# Patient Record
Sex: Male | Born: 1945 | Race: White | Hispanic: No | Marital: Married | Smoking: Former smoker
Health system: Southern US, Community
[De-identification: ages and names within clinical notes are randomized; demographics above are authoritative.]

## PROBLEM LIST (undated history)

## (undated) DIAGNOSIS — K219 Gastro-esophageal reflux disease without esophagitis: Secondary | ICD-10-CM

## (undated) DIAGNOSIS — E785 Hyperlipidemia, unspecified: Secondary | ICD-10-CM

## (undated) DIAGNOSIS — M329 Systemic lupus erythematosus, unspecified: Secondary | ICD-10-CM

## (undated) DIAGNOSIS — R42 Dizziness and giddiness: Secondary | ICD-10-CM

## (undated) DIAGNOSIS — E039 Hypothyroidism, unspecified: Secondary | ICD-10-CM

## (undated) DIAGNOSIS — G4730 Sleep apnea, unspecified: Secondary | ICD-10-CM

## (undated) DIAGNOSIS — M1990 Unspecified osteoarthritis, unspecified site: Secondary | ICD-10-CM

## (undated) DIAGNOSIS — IMO0002 Reserved for concepts with insufficient information to code with codable children: Secondary | ICD-10-CM

## (undated) HISTORY — PX: HERNIA REPAIR: SHX51

## (undated) HISTORY — PX: COLONOSCOPY: SHX174

## (undated) HISTORY — PX: TOTAL HIP ARTHROPLASTY: SHX124

## (undated) HISTORY — PX: SIGMOIDOSCOPY: SUR1295

---

## 2016-10-19 ENCOUNTER — Encounter: Payer: Self-pay | Admitting: *Deleted

## 2016-10-20 ENCOUNTER — Ambulatory Visit: Payer: Medicare Other | Admitting: Anesthesiology

## 2016-10-20 ENCOUNTER — Encounter
Admission: RE | Disposition: A | Discharge status: Home / Self Care | Payer: Self-pay | Source: Ambulatory Visit | Attending: Unknown Physician Specialty

## 2016-10-20 ENCOUNTER — Ambulatory Visit
Admission: RE | Admit: 2016-10-20 | Discharge: 2016-10-20 | Disposition: A | Discharge status: Home / Self Care | Payer: Medicare Other | Source: Ambulatory Visit | Attending: Unknown Physician Specialty | Admitting: Unknown Physician Specialty

## 2016-10-20 ENCOUNTER — Encounter: Payer: Self-pay | Admitting: Anesthesiology

## 2016-10-20 DIAGNOSIS — Z96649 Presence of unspecified artificial hip joint: Secondary | ICD-10-CM | POA: Diagnosis not present

## 2016-10-20 DIAGNOSIS — K449 Diaphragmatic hernia without obstruction or gangrene: Secondary | ICD-10-CM | POA: Diagnosis not present

## 2016-10-20 DIAGNOSIS — K640 First degree hemorrhoids: Secondary | ICD-10-CM | POA: Diagnosis not present

## 2016-10-20 DIAGNOSIS — E039 Hypothyroidism, unspecified: Secondary | ICD-10-CM | POA: Insufficient documentation

## 2016-10-20 DIAGNOSIS — Z87891 Personal history of nicotine dependence: Secondary | ICD-10-CM | POA: Diagnosis not present

## 2016-10-20 DIAGNOSIS — K635 Polyp of colon: Secondary | ICD-10-CM | POA: Diagnosis not present

## 2016-10-20 DIAGNOSIS — K219 Gastro-esophageal reflux disease without esophagitis: Secondary | ICD-10-CM | POA: Diagnosis not present

## 2016-10-20 DIAGNOSIS — G4730 Sleep apnea, unspecified: Secondary | ICD-10-CM | POA: Diagnosis not present

## 2016-10-20 DIAGNOSIS — K5730 Diverticulosis of large intestine without perforation or abscess without bleeding: Secondary | ICD-10-CM | POA: Insufficient documentation

## 2016-10-20 DIAGNOSIS — F419 Anxiety disorder, unspecified: Secondary | ICD-10-CM | POA: Insufficient documentation

## 2016-10-20 DIAGNOSIS — M329 Systemic lupus erythematosus, unspecified: Secondary | ICD-10-CM | POA: Insufficient documentation

## 2016-10-20 DIAGNOSIS — Z1211 Encounter for screening for malignant neoplasm of colon: Secondary | ICD-10-CM | POA: Diagnosis not present

## 2016-10-20 DIAGNOSIS — K222 Esophageal obstruction: Secondary | ICD-10-CM | POA: Insufficient documentation

## 2016-10-20 HISTORY — PX: COLONOSCOPY WITH PROPOFOL: SHX5780

## 2016-10-20 HISTORY — DX: Hyperlipidemia, unspecified: E78.5

## 2016-10-20 HISTORY — DX: Hypothyroidism, unspecified: E03.9

## 2016-10-20 HISTORY — PX: ESOPHAGOGASTRODUODENOSCOPY (EGD) WITH PROPOFOL: SHX5813

## 2016-10-20 HISTORY — DX: Sleep apnea, unspecified: G47.30

## 2016-10-20 HISTORY — DX: Systemic lupus erythematosus, unspecified: M32.9

## 2016-10-20 HISTORY — DX: Gastro-esophageal reflux disease without esophagitis: K21.9

## 2016-10-20 SURGERY — COLONOSCOPY WITH PROPOFOL
Anesthesia: General

## 2016-10-20 MED ORDER — PIPERACILLIN-TAZOBACTAM 3.375 G IVPB 30 MIN
3.3750 g | Freq: Once | INTRAVENOUS | Status: AC
  Administered 2016-10-20: 3.375 g via INTRAVENOUS
  Filled 2016-10-20: qty 50

## 2016-10-20 MED ORDER — GLYCOPYRROLATE 0.2 MG/ML IJ SOLN
INTRAMUSCULAR | Status: AC
  Filled 2016-10-20: qty 1

## 2016-10-20 MED ORDER — LIDOCAINE 2% (20 MG/ML) 5 ML SYRINGE
INTRAMUSCULAR | Status: AC
  Filled 2016-10-20: qty 5

## 2016-10-20 MED ORDER — PROPOFOL 10 MG/ML IV BOLUS
INTRAVENOUS | Status: AC
  Filled 2016-10-20: qty 20

## 2016-10-20 MED ORDER — LIDOCAINE HCL (CARDIAC) 20 MG/ML IV SOLN
INTRAVENOUS | Status: DC | PRN
  Administered 2016-10-20 (×4): 25 mg via INTRAVENOUS

## 2016-10-20 MED ORDER — PROPOFOL 500 MG/50ML IV EMUL
INTRAVENOUS | Status: DC | PRN
  Administered 2016-10-20 (×2): 120 ug/kg/min via INTRAVENOUS

## 2016-10-20 MED ORDER — SODIUM CHLORIDE 0.9 % IV SOLN: INTRAVENOUS | Status: DC

## 2016-10-20 MED ORDER — GLYCOPYRROLATE 0.2 MG/ML IJ SOLN
INTRAMUSCULAR | Status: DC | PRN
  Administered 2016-10-20: 0.2 mg via INTRAVENOUS

## 2016-10-20 MED ORDER — PROPOFOL 500 MG/50ML IV EMUL
INTRAVENOUS | Status: AC
  Filled 2016-10-20: qty 50

## 2016-10-20 MED ORDER — SODIUM CHLORIDE 0.9 % IV SOLN
INTRAVENOUS | Status: DC
Start: 2016-10-20 — End: 2016-10-20
  Administered 2016-10-20: 12:00:00 via INTRAVENOUS

## 2016-10-20 MED ORDER — PROPOFOL 10 MG/ML IV BOLUS
INTRAVENOUS | Status: DC | PRN
  Administered 2016-10-20: 50 mg via INTRAVENOUS
  Administered 2016-10-20: 20 mg via INTRAVENOUS
  Administered 2016-10-20 (×2): 50 mg via INTRAVENOUS
  Administered 2016-10-20: 40 mg via INTRAVENOUS
  Administered 2016-10-20: 50 mg via INTRAVENOUS

## 2016-10-20 NOTE — Op Note (Addendum)
[facility] Gastroenterology Patient Name: Cameron Yu Procedure Date: 10/20/2016 12:40 PM MRN: [id number] Account #: [id number] Date of Birth: 1946-08-09 Admit Type: Outpatient Age: 70 Room: [facility] ENDO ROOM 1 Gender: Male Note Status: Finalized Procedure:            Upper GI endoscopy Indications:          Dysphagia Providers:            Scot Junobert T. Elliott, MD Referring MD:         Danella PentonMark F. Miller, MD (Referring MD) Medicines:            Propofol per Anesthesia Complications:        No immediate complications. Procedure:            Pre-Anesthesia Assessment:                       - After reviewing the risks and benefits, the patient                        was deemed in satisfactory condition to undergo the                        procedure.                       After obtaining informed consent, the endoscope was                        passed under direct vision. Throughout the procedure,                        the patient's blood pressure, pulse, and oxygen                        saturations were monitored continuously. The Endoscope                        was introduced through the mouth, and advanced to the                        second part of duodenum. The upper GI endoscopy was                        accomplished without difficulty. The patient tolerated                        the procedure well. Findings:      A mild Schatzki ring (acquired) was found at the gastroesophageal       junction. A guidewire was placed and the scope was withdrawn. Dilation       was performed with a Savary dilator with no resistance at 17 mm.      A medium-sized hiatal hernia was present.      One non-bleeding superficial duodenal ulcer with no stigmata of bleeding       was found in the duodenal bulb. The lesion was 4 mm in largest dimension.      The examined second portion of the duodenum was normal.      The scope was reinserted after the colonoscopy and biopsies done of  soft       tissue at the GEJ to rule  out neoplasm. Impression:           - Mild Schatzki ring. Dilated.                       - Medium-sized hiatal hernia.                       - One non-bleeding duodenal ulcer with no stigmata of                        bleeding.                       - Normal examined duodenum.                       - No specimens collected. Recommendation:       - Perform a colonoscopy as previously scheduled. Procedure Code(s):    --- Professional ---                       [id number], Esophagogastroduodenoscopy, flexible, transoral;                        with insertion of guide wire followed by passage of                        dilator(s) through esophagus over guide wire Diagnosis Code(s):    --- Professional ---                       K22.2, Esophageal obstruction                       K44.9, Diaphragmatic hernia without obstruction or                        gangrene                       K26.9, Duodenal ulcer, unspecified as acute or chronic,                        without hemorrhage or perforation                       R13.10, Dysphagia, unspecified CPT copyright 2016 American Medical Association. All rights reserved. The codes documented in this report are preliminary and upon coder review may  be revised to meet current compliance requirements. Scot Junobert T Elliott, MD 10/20/2016 12:59:38 PM This report has been signed electronically. Number of Addenda: 0 Note Initiated On: 10/20/2016 12:40 PM      [facility]

## 2016-10-20 NOTE — Anesthesia Postprocedure Evaluation (Signed)
Anesthesia Post Note  Patient: Cameron Sourshomas Rocha Jr.  Procedure(Yu) Performed: Procedure(Yu) (LRB): COLONOSCOPY WITH PROPOFOL (N/A) ESOPHAGOGASTRODUODENOSCOPY (EGD) WITH PROPOFOL (N/A)  Patient location during evaluation: Endoscopy Anesthesia Type: General Level of consciousness: awake and alert Pain management: pain level controlled Vital Signs Assessment: post-procedure vital signs reviewed and stable Respiratory status: spontaneous breathing, nonlabored ventilation, respiratory function stable and patient connected to nasal cannula oxygen Cardiovascular status: blood pressure returned to baseline and stable Postop Assessment: no signs of nausea or vomiting Anesthetic complications: no     Last Vitals:  Vitals:   10/20/16 1356 10/20/16 1406  BP: 105/70 121/69  Pulse: 73 72  Resp: 15 14  Temp:      Last Pain:  Vitals:   10/20/16 1406  TempSrc:   PainSc: 0-No pain                 Cameron Yu,Cameron Yu

## 2016-10-20 NOTE — Transfer of Care (Signed)
Immediate Anesthesia Transfer of Care Note  Patient: Cameron Sourshomas Nobrega Jr.  Procedure(s) Performed: Procedure(s): COLONOSCOPY WITH PROPOFOL (N/A) ESOPHAGOGASTRODUODENOSCOPY (EGD) WITH PROPOFOL (N/A)  Patient Location: Endoscopy Unit  Anesthesia Type:General  Level of Consciousness: awake and alert   Airway & Oxygen Therapy: Patient Spontanous Breathing and Patient connected to nasal cannula oxygen  Post-op Assessment: Report given to RN and Post -op Vital signs reviewed and stable  Post vital signs: Reviewed  Last Vitals:  Vitals:   10/20/16 1336 10/20/16 1337  BP:  109/81  Pulse: (P) 85 86  Resp: (P) 17 17  Temp:  36.3 C    Last Pain:  Vitals:   10/20/16 1201  TempSrc: Oral         Complications: No apparent anesthesia complications

## 2016-10-20 NOTE — H&P (Signed)
Primary Care Physician:  Danella PentonMark F Miller, MD Primary Gastroenterologist:  Dr. Mechele CollinElliott  Pre-Procedure History & Physical: HPI:  Cameron Sourshomas Matos Jr. is a 70 y.o. male is here for an endoscopy and colonoscopy.   Past Medical History:  Diagnosis Date  . Elevated lipids   . GERD (gastroesophageal reflux disease)   . Hypothyroidism   . SLE exacerbation (HCC)   . Sleep apnea     Past Surgical History:  Procedure Laterality Date  . COLONOSCOPY    . HERNIA REPAIR Left 2014 and 2016  . SIGMOIDOSCOPY    . TOTAL HIP ARTHROPLASTY      Prior to Admission medications   Medication Sig Start Date End Date Taking? Authorizing Provider  ALPRAZolam Prudy Feeler(XANAX) 0.25 MG tablet Take 0.25 mg by mouth at bedtime as needed for anxiety.   Yes Historical Provider, MD  Ascorbic Acid (VITAMIN C) 100 MG tablet Take 100 mg by mouth daily.   Yes Historical Provider, MD  atorvastatin (LIPITOR) 80 MG tablet Take 80 mg by mouth daily.   Yes Historical Provider, MD  buPROPion (WELLBUTRIN XL) 150 MG 24 hr tablet Take 150 mg by mouth daily.   Yes Historical Provider, MD  CA-MG-VIT D-L METHYLFOL-B6-B12 PO Take by mouth.   Yes Historical Provider, MD  Cholecalciferol (VITAMIN D3) 2000 units CHEW Chew 2,000 capsules by mouth.   Yes Historical Provider, MD  desoximetasone (TOPICORT) 0.05 % cream Apply topically 2 (two) times daily.   Yes Historical Provider, MD  fluocinonide gel (LIDEX) 0.05 % Apply 1 application topically 2 (two) times daily.   Yes Historical Provider, MD  hydroxychloroquine (PLAQUENIL) 200 MG tablet Take 200 mg by mouth daily.   Yes Historical Provider, MD  levothyroxine (SYNTHROID, LEVOTHROID) 50 MCG tablet Take 50 mcg by mouth daily before breakfast.   Yes Historical Provider, MD  omeprazole (PRILOSEC) 20 MG capsule Take 20 mg by mouth daily.   Yes Historical Provider, MD  predniSONE (DELTASONE) 1 MG tablet Take 1 mg by mouth daily with breakfast.   Yes Historical Provider, MD  vitamin A [id number] UNIT  capsule Take 10,000 Units by mouth daily.   Yes Historical Provider, MD    Allergies as of 06/09/2016  . (Not on File)    History reviewed. No pertinent family history.  Social History   Social History  . Marital status: Married    Spouse name: N/A  . Number of children: N/A  . Years of education: N/A   Occupational History  . Not on file.   Social History Main Topics  . Smoking status: Former Games developermoker  . Smokeless tobacco: Never Used  . Alcohol use Yes     Comment: occasional  . Drug use: No  . Sexual activity: Not on file   Other Topics Concern  . Not on file   Social History Narrative  . No narrative on file    Review of Systems: See HPI, otherwise negative ROS,  Patient to get Zosyn for artificial hip joints.  Physical Exam: BP (!) 144/77   Pulse 86   Temp 97.5 F (36.4 C) (Oral)   Resp 16   Ht 5\' 5"  (1.651 m)   Wt 90.7 kg (200 lb)   SpO2 97%   BMI 33.28 kg/m  General:   Alert,  pleasant and cooperative in NAD Head:  Normocephalic and atraumatic. Neck:  Supple; no masses or thyromegaly. Lungs:  Clear throughout to auscultation.    Heart:  Regular rate and rhythm. Abdomen:  Soft,  nontender and nondistended. Normal bowel sounds, without guarding, and without rebound.   Neurologic:  Alert and  oriented x4;  grossly normal neurologically.  Impression/Plan: Cameron Sourshomas Dombrosky Jr. is here for an endoscopy and colonoscopy to be performed for heartburn/GERD andcolon screening.  Risks, benefits, limitations, and alternatives regarding  endoscopy and colonoscopy have been reviewed with the patient.  Questions have been answered.  All parties agreeable.   Lynnae PrudeELLIOTT, ROBERT, MD  10/20/2016, 12:41 PM

## 2016-10-20 NOTE — Anesthesia Preprocedure Evaluation (Addendum)
Anesthesia Evaluation  Patient identified by MRN, date of birth, ID band Patient awake    Reviewed: Allergy & Precautions, NPO status , Patient's Chart, lab work & pertinent test results, reviewed documented beta blocker date and time   Airway Mallampati: III  TM Distance: >3 FB     Dental  (+) Chipped, Partial Upper   Pulmonary sleep apnea , former smoker,           Cardiovascular      Neuro/Psych    GI/Hepatic GERD  ,  Endo/Other  Hypothyroidism   Renal/GU      Musculoskeletal   Abdominal   Peds  Hematology   Anesthesia Other Findings   Reproductive/Obstetrics                            Anesthesia Physical Anesthesia Plan  ASA: III  Anesthesia Plan: General   Post-op Pain Management:    Induction: Intravenous  Airway Management Planned: Nasal Cannula  Additional Equipment:   Intra-op Plan:   Post-operative Plan:   Informed Consent: I have reviewed the patients History and Physical, chart, labs and discussed the procedure including the risks, benefits and alternatives for the proposed anesthesia with the patient or authorized representative who has indicated his/her understanding and acceptance.     Plan Discussed with: CRNA  Anesthesia Plan Comments:         Anesthesia Quick Evaluation

## 2016-10-20 NOTE — Op Note (Addendum)
[facility] Gastroenterology Patient Name: Cameron Yu Procedure Date: 10/20/2016 12:39 PM MRN: [id number] Account #: [id number] Date of Birth: 09-09-1946 Admit Type: Outpatient Age: 70 [facility]OM 1 Gender: Male Note Status: Finalized Procedure:            Colonoscopy Indications:          Screening for colorectal malignant neoplasm Providers:            Scot Junobert T. Sharna Gabrys, MD Referring MD:         Danella PentonMark F. Miller, MD (Referring MD) Medicines:            Propofol per Anesthesia Complications:        No immediate complications. Procedure:            Pre-Anesthesia Assessment:                       - After reviewing the risks and benefits, the patient                        was deemed in satisfactory condition to undergo the                        procedure.                       After obtaining informed consent, the colonoscope was                        passed under direct vision. Throughout the procedure,                        the patient's blood pressure, pulse, and oxygen                        saturations were monitored continuously. The                        Colonoscope was introduced through the anus and                        advanced to the the cecum, identified by appendiceal                        orifice and ileocecal valve. The colonoscopy was                        performed without difficulty. The patient tolerated the                        procedure well. The quality of the bowel preparation                        was good. Findings:      A medium polyp was found in the descending colon. The polyp was sessile.       The polyp was removed with a hot snare. Resection and retrieval were       complete.      Multiple medium-mouthed diverticula were found in the sigmoid colon,       descending colon, transverse colon and ascending colon.      Internal hemorrhoids were found during endoscopy.  The hemorrhoids were       small and Grade I  (internal hemorrhoids that do not prolapse).      The exam was otherwise without abnormality. Impression:           - One medium polyp in the descending colon, removed                        with a hot snare. Resected and retrieved.                       - Diverticulosis in the sigmoid colon, in the                        descending colon, in the transverse colon and in the                        ascending colon.                       - Internal hemorrhoids.                       - The examination was otherwise normal. Recommendation:       - Await pathology results. Scot Junobert T Carmen Vallecillo, MD 10/20/2016 1:24:35 PM This report has been signed electronically. Number of Addenda: 0 Note Initiated On: 10/20/2016 12:39 PM Scope Withdrawal Time: 0 hours 15 minutes 28 seconds  Total Procedure Duration: 0 hours 20 minutes 31 seconds       [facility]

## 2016-10-24 ENCOUNTER — Encounter: Payer: Self-pay | Admitting: Unknown Physician Specialty

## 2016-10-24 LAB — SURGICAL PATHOLOGY

## 2019-05-26 ENCOUNTER — Other Ambulatory Visit: Payer: Self-pay | Admitting: Internal Medicine

## 2019-05-26 DIAGNOSIS — G459 Transient cerebral ischemic attack, unspecified: Secondary | ICD-10-CM

## 2019-07-16 ENCOUNTER — Ambulatory Visit
Admission: RE | Admit: 2019-07-16 | Discharge: 2019-07-16 | Disposition: A | Discharge status: Home / Self Care | Payer: Medicare Other | Source: Ambulatory Visit | Attending: Internal Medicine | Admitting: Internal Medicine

## 2019-07-16 ENCOUNTER — Other Ambulatory Visit: Payer: Self-pay

## 2019-07-16 DIAGNOSIS — G459 Transient cerebral ischemic attack, unspecified: Secondary | ICD-10-CM

## 2019-07-16 LAB — POCT I-STAT CREATININE: Creatinine, Ser: 1.1 mg/dL (ref 0.61–1.24)

## 2019-07-16 MED ORDER — GADOBUTROL 1 MMOL/ML IV SOLN
10.0000 mL | Freq: Once | INTRAVENOUS | Status: AC | PRN
  Administered 2019-07-16: 10 mL via INTRAVENOUS

## 2021-11-01 ENCOUNTER — Encounter: Payer: Self-pay | Admitting: Ophthalmology

## 2021-11-02 ENCOUNTER — Encounter: Payer: Self-pay | Admitting: Ophthalmology

## 2021-11-10 NOTE — Discharge Instructions (Signed)

## 2021-11-15 ENCOUNTER — Ambulatory Visit: Payer: Medicare Other | Admitting: Anesthesiology

## 2021-11-15 ENCOUNTER — Encounter: Payer: Self-pay | Admitting: Ophthalmology

## 2021-11-15 ENCOUNTER — Ambulatory Visit
Admission: RE | Admit: 2021-11-15 | Discharge: 2021-11-15 | Disposition: A | Discharge status: Home / Self Care | Payer: Medicare Other | Attending: Ophthalmology | Admitting: Ophthalmology

## 2021-11-15 ENCOUNTER — Encounter
Admission: RE | Disposition: A | Discharge status: Home / Self Care | Payer: Self-pay | Source: Home / Self Care | Attending: Ophthalmology

## 2021-11-15 DIAGNOSIS — E039 Hypothyroidism, unspecified: Secondary | ICD-10-CM | POA: Diagnosis not present

## 2021-11-15 DIAGNOSIS — H2511 Age-related nuclear cataract, right eye: Secondary | ICD-10-CM | POA: Diagnosis present

## 2021-11-15 DIAGNOSIS — K219 Gastro-esophageal reflux disease without esophagitis: Secondary | ICD-10-CM | POA: Diagnosis not present

## 2021-11-15 DIAGNOSIS — F419 Anxiety disorder, unspecified: Secondary | ICD-10-CM | POA: Insufficient documentation

## 2021-11-15 DIAGNOSIS — E785 Hyperlipidemia, unspecified: Secondary | ICD-10-CM | POA: Insufficient documentation

## 2021-11-15 DIAGNOSIS — G4730 Sleep apnea, unspecified: Secondary | ICD-10-CM | POA: Insufficient documentation

## 2021-11-15 DIAGNOSIS — Z87891 Personal history of nicotine dependence: Secondary | ICD-10-CM | POA: Insufficient documentation

## 2021-11-15 HISTORY — PX: CATARACT EXTRACTION W/PHACO: SHX586

## 2021-11-15 HISTORY — DX: Unspecified osteoarthritis, unspecified site: M19.90

## 2021-11-15 HISTORY — DX: Reserved for concepts with insufficient information to code with codable children: IMO0002

## 2021-11-15 HISTORY — DX: Systemic lupus erythematosus, unspecified: M32.9

## 2021-11-15 HISTORY — DX: Dizziness and giddiness: R42

## 2021-11-15 SURGERY — PHACOEMULSIFICATION, CATARACT, WITH IOL INSERTION
Anesthesia: Monitor Anesthesia Care | Site: Eye | Laterality: Right

## 2021-11-15 MED ORDER — FENTANYL CITRATE (PF) 100 MCG/2ML IJ SOLN
INTRAMUSCULAR | Status: DC | PRN
  Administered 2021-11-15: 50 ug via INTRAVENOUS

## 2021-11-15 MED ORDER — TETRACAINE HCL 0.5 % OP SOLN
1.0000 [drp] | OPHTHALMIC | Status: DC | PRN
  Administered 2021-11-15 (×3): 1 [drp] via OPHTHALMIC

## 2021-11-15 MED ORDER — MOXIFLOXACIN HCL 0.5 % OP SOLN
OPHTHALMIC | Status: DC | PRN
  Administered 2021-11-15: 0.2 mL via OPHTHALMIC

## 2021-11-15 MED ORDER — SIGHTPATH DOSE#1 BSS IO SOLN
INTRAOCULAR | Status: DC | PRN
  Administered 2021-11-15: 1 mL via INTRAMUSCULAR

## 2021-11-15 MED ORDER — ARMC OPHTHALMIC DILATING DROPS
1.0000 "application " | OPHTHALMIC | Status: DC | PRN
  Administered 2021-11-15 (×3): 1 via OPHTHALMIC

## 2021-11-15 MED ORDER — SIGHTPATH DOSE#1 BSS IO SOLN
INTRAOCULAR | Status: DC | PRN
  Administered 2021-11-15: 15 mL

## 2021-11-15 MED ORDER — SIGHTPATH DOSE#1 NA CHONDROIT SULF-NA HYALURON 40-17 MG/ML IO SOLN
INTRAOCULAR | Status: DC | PRN
  Administered 2021-11-15: 1 mL via INTRAOCULAR

## 2021-11-15 MED ORDER — BRIMONIDINE TARTRATE-TIMOLOL 0.2-0.5 % OP SOLN
OPHTHALMIC | Status: DC | PRN
  Administered 2021-11-15: 1 [drp] via OPHTHALMIC

## 2021-11-15 MED ORDER — SIGHTPATH DOSE#1 BSS IO SOLN
INTRAOCULAR | Status: DC | PRN
  Administered 2021-11-15: 13:00:00 48 mL via OPHTHALMIC

## 2021-11-15 MED ORDER — MIDAZOLAM HCL 2 MG/2ML IJ SOLN
INTRAMUSCULAR | Status: DC | PRN
  Administered 2021-11-15: 1 mg via INTRAVENOUS

## 2021-11-15 SURGICAL SUPPLY — 10 items
CATARACT SUITE SIGHTPATH (MISCELLANEOUS) ×3 IMPLANT
FEE CATARACT SUITE SIGHTPATH (MISCELLANEOUS) ×1 IMPLANT
GLOVE SURG ENC TEXT LTX SZ8 (GLOVE) ×3 IMPLANT
GLOVE SURG TRIUMPH 8.0 PF LTX (GLOVE) ×3 IMPLANT
LENS IOL TECNIS EYHANCE 21.5 (Intraocular Lens) ×2 IMPLANT
NDL FILTER BLUNT 18X1 1/2 (NEEDLE) ×1 IMPLANT
NEEDLE FILTER BLUNT 18X 1/2SAF (NEEDLE) ×2
NEEDLE FILTER BLUNT 18X1 1/2 (NEEDLE) ×1 IMPLANT
SYR 3ML LL SCALE MARK (SYRINGE) ×3 IMPLANT
WATER STERILE IRR 250ML POUR (IV SOLUTION) ×3 IMPLANT

## 2021-11-15 NOTE — Transfer of Care (Signed)
Immediate Anesthesia Transfer of Care Note  Patient: Cameron Yu.  Procedure(s) Performed: CATARACT EXTRACTION PHACO AND INTRAOCULAR LENS PLACEMENT (IOC) RIGHT 12.26 01:04.0 (Right: Eye)  Patient Location: PACU  Anesthesia Type: MAC  Level of Consciousness: awake, alert  and patient cooperative  Airway and Oxygen Therapy: Patient Spontanous Breathing and Patient connected to supplemental oxygen  Post-op Assessment: Post-op Vital signs reviewed, Patient's Cardiovascular Status Stable, Respiratory Function Stable, Patent Airway and No signs of Nausea or vomiting  Post-op Vital Signs: Reviewed and stable  Complications: No notable events documented.

## 2021-11-15 NOTE — H&P (Signed)
[facility]   Primary Care Physician:  Danella Penton, MD Ophthalmologist: Dr. Willey Blade  Pre-Procedure History & Physical: HPI:  Cameron Yu. is a 76 y.o. male here for cataract surgery.   Past Medical History:  Diagnosis Date   Arthritis    lower back   Elevated lipids    GERD (gastroesophageal reflux disease)    Hypothyroidism    Lupus (HCC)    In remisssion since mid 2021   SLE exacerbation (HCC)    Sleep apnea    Vertigo    several years ago    Past Surgical History:  Procedure Laterality Date   COLONOSCOPY     COLONOSCOPY WITH PROPOFOL N/A 10/20/2016   Procedure: COLONOSCOPY WITH PROPOFOL;  Surgeon: Scot Jun, MD;  Location: [facility] ENDOSCOPY;  Service: Endoscopy;  Laterality: N/A;   ESOPHAGOGASTRODUODENOSCOPY (EGD) WITH PROPOFOL N/A 10/20/2016   Procedure: ESOPHAGOGASTRODUODENOSCOPY (EGD) WITH PROPOFOL;  Surgeon: Scot Jun, MD;  Location: [facility] ENDOSCOPY;  Service: Endoscopy;  Laterality: N/A;   HERNIA REPAIR Left 2014 and 2016   SIGMOIDOSCOPY     TOTAL HIP ARTHROPLASTY      Prior to Admission medications   Medication Sig Start Date End Date Taking? Authorizing Provider  ALPRAZolam Prudy Feeler) 0.25 MG tablet Take 0.25 mg by mouth at bedtime as needed for anxiety.   Yes [provider]  ASPIRIN 81 PO Take by mouth daily.   Yes [provider]  buPROPion (WELLBUTRIN XL) 150 MG 24 hr tablet Take 150 mg by mouth 2 (two) times daily.   Yes [provider]  desoximetasone (TOPICORT) 0.05 % cream Apply topically 2 (two) times daily.   Yes [provider]  fluocinonide gel (LIDEX) 0.05 % Apply 1 application topically 2 (two) times daily.   Yes [provider]  folic acid (FOLVITE) 1 MG tablet Take 3 mg by mouth daily.   Yes [provider]  gabapentin (NEURONTIN) 300 MG capsule Take 300 mg by mouth 3 (three) times daily.   Yes [provider]  hydroxychloroquine (PLAQUENIL) 200 MG tablet  Take 200 mg by mouth daily.   Yes [provider]  levothyroxine (SYNTHROID, LEVOTHROID) 50 MCG tablet Take 50 mcg by mouth daily before breakfast.   Yes [provider]  methotrexate (RHEUMATREX) 7.5 MG tablet Take 7.5 mg by mouth once a week. Caution" Chemotherapy. Protect from light.   Yes [provider]  montelukast (SINGULAIR) 10 MG tablet Take 10 mg by mouth daily as needed.   Yes [provider]  Multiple Vitamins-Minerals (CENTRUM SILVER 50+MEN PO) Take by mouth daily.   Yes [provider]  omeprazole (PRILOSEC) 20 MG capsule Take 40 mg by mouth daily.   Yes [provider]  rosuvastatin (CRESTOR) 10 MG tablet Take 10 mg by mouth daily.   Yes [provider]  Trolamine Salicylate (ASPERCREME EX) Apply topically as needed.   Yes [provider]  amoxicillin (AMOXIL) 500 MG tablet Take 2,000 mg by mouth. Prior to dental procedures    [provider]    Allergies as of 08/25/2021 - Review Complete 10/20/2016  Allergen Reaction Noted   Buspirone  10/19/2016   Oxycodone hcl  10/19/2016   Sertraline hcl  10/19/2016    History reviewed. No pertinent family history.  Social History   Socioeconomic History   Marital status: Married    Spouse name: Not on file   Number of children: Not on file   Years of education:  Not on file   Highest education level: Not on file  Occupational History   Not on file  Tobacco Use   Smoking status: Former    Types: Cigarettes    Quit date: 16    Years since quitting: 56.1   Smokeless tobacco: Never  Vaping Use   Vaping Use: Never used  Substance and Sexual Activity   Alcohol use: Yes    Alcohol/week: 7.0 standard drinks    Types: 7 Standard drinks or equivalent per week    Comment: occasional   Drug use: No   Sexual activity: Not on file  Other Topics Concern   Not on file  Social History Narrative   Not on file   Social Determinants of Health    Financial Resource Strain: Not on file  Food Insecurity: Not on file  Transportation Needs: Not on file  Physical Activity: Not on file  Stress: Not on file  Social Connections: Not on file  Intimate Partner Violence: Not on file    Review of Systems: See HPI, otherwise negative ROS  Physical Exam: BP (!) 151/68    Pulse 76    Temp 98.2 F (36.8 C) (Temporal)    Resp 15    Ht 5\' 5"  (1.651 m)    Wt 87.5 kg    SpO2 98%    BMI 32.12 kg/m  General:   Alert, cooperative in NAD Head:  Normocephalic and atraumatic. Respiratory:  Normal work of breathing. Cardiovascular:  RRR  Impression/Plan: . is here for cataract surgery.  Risks, benefits, limitations, and alternatives regarding cataract surgery have been reviewed with the patient.  Questions have been answered.  All parties agreeable.   Cameron Sours, MD  11/15/2021, 12:31 PM

## 2021-11-15 NOTE — Anesthesia Postprocedure Evaluation (Signed)
Anesthesia Post Note  Patient: Cameron Yu.  Procedure(s) Performed: CATARACT EXTRACTION PHACO AND INTRAOCULAR LENS PLACEMENT (IOC) RIGHT 12.26 01:04.0 (Right: Eye)     Patient location during evaluation: PACU Anesthesia Type: MAC Level of consciousness: awake Pain management: pain level controlled Vital Signs Assessment: post-procedure vital signs reviewed and stable Respiratory status: respiratory function stable Cardiovascular status: stable Postop Assessment: no apparent nausea or vomiting Anesthetic complications: no   No notable events documented.  Veda Canning

## 2021-11-15 NOTE — Anesthesia Preprocedure Evaluation (Signed)
Anesthesia Evaluation  Patient identified by MRN, date of birth, ID band Patient awake    Reviewed: Allergy & Precautions, NPO status   Airway Mallampati: II  TM Distance: >3 FB     Dental   Pulmonary sleep apnea , former smoker,    Pulmonary exam normal        Cardiovascular  Rhythm:Regular Rate:Normal  HLD   Neuro/Psych Anxiety    GI/Hepatic GERD  ,  Endo/Other  Hypothyroidism BMI 33  Renal/GU      Musculoskeletal  (+) Arthritis ,   Abdominal   Peds  Hematology   Anesthesia Other Findings SLE - remission since 2021  Reproductive/Obstetrics                             Anesthesia Physical Anesthesia Plan  ASA: 2  Anesthesia Plan: MAC   Post-op Pain Management: Minimal or no pain anticipated   Induction: Intravenous  PONV Risk Score and Plan: TIVA, Midazolam and Treatment may vary due to age or medical condition  Airway Management Planned: Natural Airway and Nasal Cannula  Additional Equipment:   Intra-op Plan:   Post-operative Plan:   Informed Consent: I have reviewed the patients History and Physical, chart, labs and discussed the procedure including the risks, benefits and alternatives for the proposed anesthesia with the patient or authorized representative who has indicated his/her understanding and acceptance.       Plan Discussed with: CRNA  Anesthesia Plan Comments:         Anesthesia Quick Evaluation

## 2021-11-15 NOTE — Op Note (Signed)
PREOPERATIVE DIAGNOSIS:  Nuclear sclerotic cataract of the right eye.   POSTOPERATIVE DIAGNOSIS:  H25.11 Cataract   OPERATIVE PROCEDURE:ORPROCALL@   SURGEON:  Galen Manila, MD.   ANESTHESIA:  Anesthesiologist: Jola Babinski, MD CRNA: Maree Krabbe, CRNA  1.      Managed anesthesia care. 2.      0.21ml of Shugarcaine was instilled in the eye following the paracentesis.   COMPLICATIONS:  None.   TECHNIQUE:   Stop and chop   DESCRIPTION OF PROCEDURE:  The patient was examined and consented in the preoperative holding area where the aforementioned topical anesthesia was applied to the right eye and then brought back to the Operating Room where the right eye was prepped and draped in the usual sterile ophthalmic fashion and a lid speculum was placed. A paracentesis was created with the side port blade and the anterior chamber was filled with viscoelastic. A near clear corneal incision was performed with the steel keratome. A continuous curvilinear capsulorrhexis was performed with a cystotome followed by the capsulorrhexis forceps. Hydrodissection and hydrodelineation were carried out with BSS on a blunt cannula. The lens was removed in a stop and chop  technique and the remaining cortical material was removed with the irrigation-aspiration handpiece. The capsular bag was inflated with viscoelastic and the Technis [id number]  lens was placed in the capsular bag without complication. The remaining viscoelastic was removed from the eye with the irrigation-aspiration handpiece. The wounds were hydrated. The anterior chamber was flushed with BSS and the eye was inflated to physiologic pressure. 0.22ml of Vigamox was placed in the anterior chamber. The wounds were found to be water tight. The eye was dressed with Combigan. The patient was given protective glasses to wear throughout the day and a shield with which to sleep tonight. The patient was also given drops with which to begin a drop regimen today and  will follow-up with me in one day. Implant Name Type Inv. Item Serial No. Manufacturer Lot No. LRB No. Used Action  LENS IOL TECNIS EYHANCE 21.5 - [id number] Intraocular Lens LENS IOL TECNIS EYHANCE 21.5 [id number] [employer]  Right 1 Implanted   Procedure(s): CATARACT EXTRACTION PHACO AND INTRAOCULAR LENS PLACEMENT (IOC) RIGHT 12.26 01:04.0 (Right)  Electronically signed: Galen Manila 11/15/2021 12:54 PM

## 2021-11-16 ENCOUNTER — Encounter: Payer: Self-pay | Admitting: Ophthalmology

## 2021-11-17 ENCOUNTER — Encounter: Payer: Self-pay | Admitting: Ophthalmology

## 2021-11-24 NOTE — Discharge Instructions (Signed)

## 2021-11-29 ENCOUNTER — Encounter: Payer: Self-pay | Admitting: Ophthalmology

## 2021-11-29 ENCOUNTER — Ambulatory Visit: Payer: Medicare Other | Admitting: Anesthesiology

## 2021-11-29 ENCOUNTER — Other Ambulatory Visit: Payer: Self-pay

## 2021-11-29 ENCOUNTER — Ambulatory Visit
Admission: RE | Admit: 2021-11-29 | Discharge: 2021-11-29 | Disposition: A | Discharge status: Home / Self Care | Payer: Medicare Other | Attending: Ophthalmology | Admitting: Ophthalmology

## 2021-11-29 ENCOUNTER — Encounter
Admission: RE | Disposition: A | Discharge status: Home / Self Care | Payer: Self-pay | Source: Home / Self Care | Attending: Ophthalmology

## 2021-11-29 DIAGNOSIS — G4730 Sleep apnea, unspecified: Secondary | ICD-10-CM | POA: Diagnosis not present

## 2021-11-29 DIAGNOSIS — M329 Systemic lupus erythematosus, unspecified: Secondary | ICD-10-CM | POA: Diagnosis not present

## 2021-11-29 DIAGNOSIS — Z87891 Personal history of nicotine dependence: Secondary | ICD-10-CM | POA: Insufficient documentation

## 2021-11-29 DIAGNOSIS — K219 Gastro-esophageal reflux disease without esophagitis: Secondary | ICD-10-CM | POA: Diagnosis not present

## 2021-11-29 DIAGNOSIS — H2512 Age-related nuclear cataract, left eye: Secondary | ICD-10-CM | POA: Diagnosis not present

## 2021-11-29 HISTORY — PX: CATARACT EXTRACTION W/PHACO: SHX586

## 2021-11-29 SURGERY — PHACOEMULSIFICATION, CATARACT, WITH IOL INSERTION
Anesthesia: Monitor Anesthesia Care | Site: Eye | Laterality: Left

## 2021-11-29 MED ORDER — TETRACAINE HCL 0.5 % OP SOLN
1.0000 [drp] | OPHTHALMIC | Status: DC | PRN
  Administered 2021-11-29 (×3): 1 [drp] via OPHTHALMIC

## 2021-11-29 MED ORDER — ACETAMINOPHEN 325 MG PO TABS: 325.0000 mg | ORAL_TABLET | ORAL | Status: DC | PRN

## 2021-11-29 MED ORDER — FENTANYL CITRATE (PF) 100 MCG/2ML IJ SOLN
INTRAMUSCULAR | Status: DC | PRN
  Administered 2021-11-29: 50 ug via INTRAVENOUS

## 2021-11-29 MED ORDER — MOXIFLOXACIN HCL 0.5 % OP SOLN
OPHTHALMIC | Status: DC | PRN
  Administered 2021-11-29: 0.2 mL via OPHTHALMIC

## 2021-11-29 MED ORDER — SIGHTPATH DOSE#1 BSS IO SOLN
INTRAOCULAR | Status: DC | PRN
  Administered 2021-11-29: 59 mL via OPHTHALMIC

## 2021-11-29 MED ORDER — MIDAZOLAM HCL 2 MG/2ML IJ SOLN
INTRAMUSCULAR | Status: DC | PRN
  Administered 2021-11-29: 2 mg via INTRAVENOUS

## 2021-11-29 MED ORDER — SIGHTPATH DOSE#1 BSS IO SOLN
INTRAOCULAR | Status: DC | PRN
  Administered 2021-11-29: 15 mL via INTRAOCULAR

## 2021-11-29 MED ORDER — BRIMONIDINE TARTRATE-TIMOLOL 0.2-0.5 % OP SOLN
OPHTHALMIC | Status: DC | PRN
  Administered 2021-11-29: 1 [drp] via OPHTHALMIC

## 2021-11-29 MED ORDER — ARMC OPHTHALMIC DILATING DROPS
1.0000 "application " | OPHTHALMIC | Status: DC | PRN
  Administered 2021-11-29 (×3): 1 via OPHTHALMIC

## 2021-11-29 MED ORDER — SIGHTPATH DOSE#1 NA CHONDROIT SULF-NA HYALURON 40-17 MG/ML IO SOLN
INTRAOCULAR | Status: DC | PRN
  Administered 2021-11-29: 1 mL via INTRAOCULAR

## 2021-11-29 MED ORDER — SIGHTPATH DOSE#1 BSS IO SOLN
INTRAOCULAR | Status: DC | PRN
  Administered 2021-11-29: 2 mL

## 2021-11-29 MED ORDER — LACTATED RINGERS IV SOLN: INTRAVENOUS | Status: DC

## 2021-11-29 MED ORDER — ACETAMINOPHEN 160 MG/5ML PO SOLN: 325.0000 mg | ORAL | Status: DC | PRN

## 2021-11-29 SURGICAL SUPPLY — 12 items
CANNULA ANT/CHMB 27G (MISCELLANEOUS) IMPLANT
CANNULA ANT/CHMB 27GA (MISCELLANEOUS) ×2 IMPLANT
CATARACT SUITE SIGHTPATH (MISCELLANEOUS) ×2 IMPLANT
FEE CATARACT SUITE SIGHTPATH (MISCELLANEOUS) ×1 IMPLANT
GLOVE SURG ENC TEXT LTX SZ8 (GLOVE) ×2 IMPLANT
GLOVE SURG TRIUMPH 8.0 PF LTX (GLOVE) ×2 IMPLANT
LENS IOL TECNIS EYHANCE 22.0 (Intraocular Lens) ×1 IMPLANT
NDL FILTER BLUNT 18X1 1/2 (NEEDLE) ×1 IMPLANT
NEEDLE FILTER BLUNT 18X 1/2SAF (NEEDLE) ×1
NEEDLE FILTER BLUNT 18X1 1/2 (NEEDLE) ×1 IMPLANT
SYR 3ML LL SCALE MARK (SYRINGE) ×2 IMPLANT
WATER STERILE IRR 250ML POUR (IV SOLUTION) ×2 IMPLANT

## 2021-11-29 NOTE — Anesthesia Procedure Notes (Signed)
Procedure Name: MAC Date/Time: 11/29/2021 7:59 AM Performed by: Jeannene Patella, CRNA Pre-anesthesia Checklist: Patient identified, Emergency Drugs available, Suction available, Timeout performed and Patient being monitored Patient Re-evaluated:Patient Re-evaluated prior to induction Oxygen Delivery Method: Nasal cannula Placement Confirmation: positive ETCO2

## 2021-11-29 NOTE — Op Note (Signed)
PREOPERATIVE DIAGNOSIS:  Nuclear sclerotic cataract of the left eye.   POSTOPERATIVE DIAGNOSIS:  Nuclear sclerotic cataract of the left eye.   OPERATIVE PROCEDURE:ORPROCALL@   SURGEON:  Galen Manila, MD.   ANESTHESIA:  Anesthesiologist: Baxter Flattery, MD CRNA: Jinny Blossom, CRNA  1.      Managed anesthesia care. 2.     0.60ml of Shugarcaine was instilled following the paracentesis   COMPLICATIONS:  None.   TECHNIQUE:   Stop and chop   DESCRIPTION OF PROCEDURE:  The patient was examined and consented in the preoperative holding area where the aforementioned topical anesthesia was applied to the left eye and then brought back to the Operating Room where the left eye was prepped and draped in the usual sterile ophthalmic fashion and a lid speculum was placed. A paracentesis was created with the side port blade and the anterior chamber was filled with viscoelastic. A near clear corneal incision was performed with the steel keratome. A continuous curvilinear capsulorrhexis was performed with a cystotome followed by the capsulorrhexis forceps. Hydrodissection and hydrodelineation were carried out with BSS on a blunt cannula. The lens was removed in a stop and chop  technique and the remaining cortical material was removed with the irrigation-aspiration handpiece. The capsular bag was inflated with viscoelastic and the Technis [id number] lens was placed in the capsular bag without complication. The remaining viscoelastic was removed from the eye with the irrigation-aspiration handpiece. The wounds were hydrated. The anterior chamber was flushed with BSS and the eye was inflated to physiologic pressure. 0.36ml Vigamox was placed in the anterior chamber. The wounds were found to be water tight. The eye was dressed with Combigan. The patient was given protective glasses to wear throughout the day and a shield with which to sleep tonight. The patient was also given drops with which to begin a drop  regimen today and will follow-up with me in one day. Implant Name Type Inv. Item Serial No. Manufacturer Lot No. LRB No. Used Action  LENS IOL TECNIS EYHANCE 22.0 - [id number] Intraocular Lens LENS IOL TECNIS EYHANCE 22.0 [id number] [employer]  Left 1 Implanted    Procedure(s): CATARACT EXTRACTION PHACO AND INTRAOCULAR LENS PLACEMENT (IOC) LEFT 11.46 01:02.6 (Left)  Electronically signed: Galen Manila 11/29/2021 8:13 AM

## 2021-11-29 NOTE — Anesthesia Preprocedure Evaluation (Signed)
Anesthesia Evaluation  Patient identified by MRN, date of birth, ID band Patient awake    Reviewed: Allergy & Precautions, H&P , NPO status , Patient's Chart, lab work & pertinent test results, reviewed documented beta blocker date and time   Airway Mallampati: II  TM Distance: >3 FB Neck ROM: full    Dental no notable dental hx. (+) Partial Lower   Pulmonary sleep apnea , former smoker,    Pulmonary exam normal breath sounds clear to auscultation       Cardiovascular Exercise Tolerance: Good negative cardio ROS Normal cardiovascular exam Rhythm:regular Rate:Normal     Neuro/Psych negative neurological ROS  negative psych ROS   GI/Hepatic Neg liver ROS, GERD  ,  Endo/Other  Hypothyroidism   Renal/GU negative Renal ROS  negative genitourinary   Musculoskeletal   Abdominal   Peds  Hematology negative hematology ROS (+) Lupus, in remission   Anesthesia Other Findings   Reproductive/Obstetrics negative OB ROS                             Anesthesia Physical Anesthesia Plan  ASA: 2  Anesthesia Plan: MAC   Post-op Pain Management:    Induction:   PONV Risk Score and Plan:   Airway Management Planned:   Additional Equipment:   Intra-op Plan:   Post-operative Plan:   Informed Consent: I have reviewed the patients History and Physical, chart, labs and discussed the procedure including the risks, benefits and alternatives for the proposed anesthesia with the patient or authorized representative who has indicated his/her understanding and acceptance.     Dental Advisory Given  Plan Discussed with: CRNA and Anesthesiologist  Anesthesia Plan Comments:         Anesthesia Quick Evaluation

## 2021-11-29 NOTE — H&P (Signed)
[facility]   Primary Care Physician:  Cameron Penton, MD Ophthalmologist: Dr. Druscilla Yu  Pre-Procedure History & Physical: HPI:  Cameron Yu. is a 76 y.o. male here for cataract surgery.   Past Medical History:  Diagnosis Date   Arthritis    lower back   Elevated lipids    GERD (gastroesophageal reflux disease)    Hypothyroidism    Lupus (HCC)    In remisssion since mid 2021   SLE exacerbation (HCC)    Sleep apnea    Vertigo    several years ago    Past Surgical History:  Procedure Laterality Date   CATARACT EXTRACTION W/PHACO Right 11/15/2021   Procedure: CATARACT EXTRACTION PHACO AND INTRAOCULAR LENS PLACEMENT (IOC) RIGHT 12.26 01:04.0;  Surgeon: Cameron Manila, MD;  Location: [facility];  Service: Ophthalmology;  Laterality: Right;   COLONOSCOPY     COLONOSCOPY WITH PROPOFOL N/A 10/20/2016   Procedure: COLONOSCOPY WITH PROPOFOL;  Surgeon: Cameron Jun, MD;  Location: [facility];  Service: Endoscopy;  Laterality: N/A;   ESOPHAGOGASTRODUODENOSCOPY (EGD) WITH PROPOFOL N/A 10/20/2016   Procedure: ESOPHAGOGASTRODUODENOSCOPY (EGD) WITH PROPOFOL;  Surgeon: Cameron Jun, MD;  Location: [facility] END[facility];  Service: Endoscopy;  Laterality: N/A;   HERNIA REPAIR Left 2014 and 2016   SIGMOIDOSCOPY     TOTAL HIP ARTHROPLASTY      Prior to Admission medications   Medication Sig Start Date End Date Taking? Authorizing Provider  ALPRAZolam Prudy Feeler) 0.25 MG tablet Take 0.25 mg by mouth at bedtime as needed for anxiety.   Yes [provider]  amoxicillin (AMOXIL) 500 MG tablet Take 2,000 mg by mouth. Prior to dental procedures   Yes [provider]  ASPIRIN 81 PO Take by mouth daily.   Yes [provider]  buPROPion (WELLBUTRIN XL) 150 MG 24 hr tablet Take 150 mg by mouth 2 (two) times daily.   Yes [provider]  desoximetasone (TOPICORT) 0.05 % cream Apply topically 2 (two) times daily.   Yes [provider]   fluocinonide gel (LIDEX) 0.05 % Apply 1 application topically 2 (two) times daily.   Yes [provider]  folic acid (FOLVITE) 1 MG tablet Take 3 mg by mouth daily.   Yes [provider]  gabapentin (NEURONTIN) 300 MG capsule Take 300 mg by mouth 3 (three) times daily.   Yes [provider]  hydroxychloroquine (PLAQUENIL) 200 MG tablet Take 200 mg by mouth daily.   Yes [provider]  levothyroxine (SYNTHROID, LEVOTHROID) 50 MCG tablet Take 50 mcg by mouth daily before breakfast.   Yes [provider]  methotrexate (RHEUMATREX) 7.5 MG tablet Take 7.5 mg by mouth once a week. Caution" Chemotherapy. Protect from light.   Yes [provider]  montelukast (SINGULAIR) 10 MG tablet Take 10 mg by mouth daily as needed.   Yes [provider]  Multiple Vitamins-Minerals (CENTRUM SILVER 50+MEN PO) Take by mouth daily.   Yes [provider]  omeprazole (PRILOSEC) 20 MG capsule Take 40 mg by mouth daily.   Yes [provider]  rosuvastatin (CRESTOR) 10 MG tablet Take 10 mg by mouth daily.   Yes [provider]  Trolamine Salicylate (ASPERCREME EX) Apply topically as needed.   Yes [provider]    Allergies as of 08/25/2021 - Review Complete 10/20/2016  Allergen Reaction Noted   Buspirone  10/19/2016   Oxycodone hcl  10/19/2016   Sertraline hcl  10/19/2016    History reviewed. No  pertinent family history.  Social History   Socioeconomic History   Marital status: Married    Spouse name: Not on file   Number of children: Not on file   Years of education: Not on file   Highest education level: Not on file  Occupational History   Not on file  Tobacco Use   Smoking status: Former    Types: Cigarettes    Quit date: 62    Years since quitting: 56.1   Smokeless tobacco: Never  Vaping Use   Vaping Use: Never used  Substance and Sexual Activity   Alcohol use: Yes    Alcohol/week: 7.0 standard  drinks    Types: 7 Standard drinks or equivalent per week    Comment: occasional   Drug use: No   Sexual activity: Not on file  Other Topics Concern   Not on file  Social History Narrative   Not on file   Social Determinants of Health   Financial Resource Strain: Not on file  Food Insecurity: Not on file  Transportation Needs: Not on file  Physical Activity: Not on file  Stress: Not on file  Social Connections: Not on file  Intimate Partner Violence: Not on file    Review of Systems: See HPI, otherwise negative ROS  Physical Exam: BP (!) 141/74    Pulse 72    Temp 97.7 F (36.5 C) (Temporal)    Resp 16    Ht 5\' 5"  (1.651 m)    Wt 87.5 kg    SpO2 98%    BMI 32.12 kg/m  General:   Alert, cooperative in NAD Head:  Normocephalic and atraumatic. Respiratory:  Normal work of breathing. Cardiovascular:  RRR  Impression/Plan: . is here for cataract surgery.  Risks, benefits, limitations, and alternatives regarding cataract surgery have been reviewed with the patient.  Questions have been answered.  All parties agreeable.   Cameron Sours, MD  11/29/2021, 7:51 AM

## 2021-11-29 NOTE — Anesthesia Postprocedure Evaluation (Signed)
Anesthesia Post Note  Patient: Cameron Yu.  Procedure(s) Performed: CATARACT EXTRACTION PHACO AND INTRAOCULAR LENS PLACEMENT (IOC) LEFT 11.46 01:02.6 (Left: Eye)     Patient location during evaluation: PACU Anesthesia Type: MAC Level of consciousness: awake and alert Pain management: pain level controlled Vital Signs Assessment: post-procedure vital signs reviewed and stable Respiratory status: spontaneous breathing, nonlabored ventilation, respiratory function stable and patient connected to nasal cannula oxygen Cardiovascular status: stable and blood pressure returned to baseline Postop Assessment: no apparent nausea or vomiting Anesthetic complications: no   No notable events documented.  Trecia Rogers

## 2021-11-29 NOTE — Transfer of Care (Signed)
Immediate Anesthesia Transfer of Care Note  Patient: Cameron Yu.  Procedure(s) Performed: CATARACT EXTRACTION PHACO AND INTRAOCULAR LENS PLACEMENT (IOC) LEFT 11.46 01:02.6 (Left: Eye)  Patient Location: PACU  Anesthesia Type: MAC  Level of Consciousness: awake, alert  and patient cooperative  Airway and Oxygen Therapy: Patient Spontanous Breathing and Patient connected to supplemental oxygen  Post-op Assessment: Post-op Vital signs reviewed, Patient's Cardiovascular Status Stable, Respiratory Function Stable, Patent Airway and No signs of Nausea or vomiting  Post-op Vital Signs: Reviewed and stable  Complications: No notable events documented.

## 2021-11-30 ENCOUNTER — Encounter: Payer: Self-pay | Admitting: Ophthalmology

## 2022-01-11 ENCOUNTER — Other Ambulatory Visit: Payer: Self-pay | Admitting: Internal Medicine

## 2022-01-11 DIAGNOSIS — G20 Parkinson's disease: Secondary | ICD-10-CM

## 2022-01-11 DIAGNOSIS — G20A1 Parkinson's disease without dyskinesia, without mention of fluctuations: Secondary | ICD-10-CM

## 2022-01-11 DIAGNOSIS — G119 Hereditary ataxia, unspecified: Secondary | ICD-10-CM

## 2022-01-22 ENCOUNTER — Ambulatory Visit
Admission: RE | Admit: 2022-01-22 | Discharge: 2022-01-22 | Disposition: A | Discharge status: Home / Self Care | Payer: Medicare Other | Source: Ambulatory Visit | Attending: Internal Medicine | Admitting: Internal Medicine

## 2022-01-22 DIAGNOSIS — G20 Parkinson's disease: Secondary | ICD-10-CM | POA: Insufficient documentation

## 2022-01-22 DIAGNOSIS — G119 Hereditary ataxia, unspecified: Secondary | ICD-10-CM | POA: Diagnosis present

## 2022-04-05 ENCOUNTER — Emergency Department
Admission: EM | Admit: 2022-04-05 | Discharge: 2022-04-05 | Disposition: A | Discharge status: Other Acute Inpatient Hospital | Payer: Medicare Other | Attending: Emergency Medicine | Admitting: Emergency Medicine

## 2022-04-05 ENCOUNTER — Emergency Department: Payer: Medicare Other

## 2022-04-05 DIAGNOSIS — M9701XA Periprosthetic fracture around internal prosthetic right hip joint, initial encounter: Secondary | ICD-10-CM | POA: Insufficient documentation

## 2022-04-05 DIAGNOSIS — M978XXA Periprosthetic fracture around other internal prosthetic joint, initial encounter: Secondary | ICD-10-CM

## 2022-04-05 DIAGNOSIS — G20 Parkinson's disease: Secondary | ICD-10-CM | POA: Insufficient documentation

## 2022-04-05 DIAGNOSIS — W19XXXA Unspecified fall, initial encounter: Secondary | ICD-10-CM | POA: Diagnosis not present

## 2022-04-05 DIAGNOSIS — Z96643 Presence of artificial hip joint, bilateral: Secondary | ICD-10-CM | POA: Diagnosis not present

## 2022-04-05 DIAGNOSIS — E039 Hypothyroidism, unspecified: Secondary | ICD-10-CM | POA: Insufficient documentation

## 2022-04-05 LAB — CBC WITH DIFFERENTIAL/PLATELET
Abs Immature Granulocytes: 0.03 10*3/uL (ref 0.00–0.07)
Basophils Absolute: 0 10*3/uL (ref 0.0–0.1)
Basophils Relative: 1 %
Eosinophils Absolute: 0.6 10*3/uL — ABNORMAL HIGH (ref 0.0–0.5)
Eosinophils Relative: 7 %
HCT: 43.7 % (ref 39.0–52.0)
Hemoglobin: 14.7 g/dL (ref 13.0–17.0)
Immature Granulocytes: 0 %
Lymphocytes Relative: 22 %
Lymphs Abs: 1.7 10*3/uL (ref 0.7–4.0)
MCH: 30.3 pg (ref 26.0–34.0)
MCHC: 33.6 g/dL (ref 30.0–36.0)
MCV: 90.1 fL (ref 80.0–100.0)
Monocytes Absolute: 0.6 10*3/uL (ref 0.1–1.0)
Monocytes Relative: 8 %
Neutro Abs: 4.9 10*3/uL (ref 1.7–7.7)
Neutrophils Relative %: 62 %
Platelets: 278 10*3/uL (ref 150–400)
RBC: 4.85 MIL/uL (ref 4.22–5.81)
RDW: 12.3 % (ref 11.5–15.5)
WBC: 7.8 10*3/uL (ref 4.0–10.5)
nRBC: 0 % (ref 0.0–0.2)

## 2022-04-05 LAB — BASIC METABOLIC PANEL
Anion gap: 7 (ref 5–15)
BUN: 20 mg/dL (ref 8–23)
CO2: 24 mmol/L (ref 22–32)
Calcium: 9.3 mg/dL (ref 8.9–10.3)
Chloride: 108 mmol/L (ref 98–111)
Creatinine, Ser: 1.09 mg/dL (ref 0.61–1.24)
GFR, Estimated: >60 mL/min (ref >60)
Glucose, Bld: 190 mg/dL — ABNORMAL HIGH (ref 70–99)
Potassium: 4.3 mmol/L (ref 3.5–5.1)
Sodium: 139 mmol/L (ref 135–145)

## 2022-04-05 MED ORDER — MORPHINE SULFATE (PF) 4 MG/ML IV SOLN
4.0000 mg | INTRAVENOUS | Status: AC
Start: 2022-04-05 — End: 2022-04-05
  Administered 2022-04-05: 4 mg via INTRAVENOUS
  Filled 2022-04-05: qty 1

## 2022-04-05 MED ORDER — HYDROMORPHONE HCL 1 MG/ML IJ SOLN
0.5000 mg | Freq: Once | INTRAMUSCULAR | Status: AC
  Administered 2022-04-05: 0.5 mg via INTRAVENOUS
  Filled 2022-04-05: qty 0.5

## 2022-04-05 MED ORDER — MORPHINE SULFATE (PF) 4 MG/ML IV SOLN
4.0000 mg | Freq: Once | INTRAVENOUS | Status: AC
  Administered 2022-04-05: 4 mg via INTRAVENOUS
  Filled 2022-04-05: qty 1

## 2022-04-05 NOTE — ED Provider Notes (Signed)
[facility] Provider Note    Event Date/Time   First MD Initiated Contact with Patient 04/05/22 1244     (approximate)   History   Chief Complaint Fall (Previous right and left hip replacement , fell , pain to left hip, left leg shortening, no loc, no thinners , tripped on rv steps )   HPI  Cameron Desa. is a 76 y.o. male with past medical history of lupus, hypothyroidism, GERD, and Parkinson disease who presents to the ED complaining of fall.  Patient reports that he was going down the steps of his RV just prior to arrival when he lost his balance and twisted his right leg.  He had immediate onset of pain around his right hip and has been unable to bear weight on the right leg since then.  He denies hitting his head or losing consciousness, denies any pain in his head, neck, chest, abdomen, or upper extremities.  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  He received IV fentanyl with EMS with improvement in his pain.     Physical Exam   Triage Vital Signs: ED Triage Vitals [04/05/22 1247]  Enc Vitals Group     BP (!) 144/75     Pulse Rate 88     Resp 17     Temp 98.3 F (36.8 C)     Temp Source Oral     SpO2 98 %     Weight 198 lb 3.1 oz (89.9 kg)     Height 5\' 6"  (1.676 m)     Head Circumference      Peak Flow      Pain Score      Pain Loc      Pain Edu?      Excl. in GC?     Most recent vital signs: Vitals:   04/05/22 1247 04/05/22 1401  BP: (!) 144/75 (!) 146/86  Pulse: 88 87  Resp: 17 17  Temp: 98.3 F (36.8 C) (!) 96.9 F (36.1 C)  SpO2: 98% 98%    Constitutional: Alert and oriented. Eyes: Conjunctivae are normal. Head: Atraumatic. Nose: No congestion/rhinnorhea. Mouth/Throat: Mucous membranes are moist.  Neck: No midline cervical spine tenderness to palpation. Cardiovascular: Normal rate, regular rhythm. Grossly normal heart sounds.  2+ radial and DP pulses bilaterally. Respiratory: Normal respiratory effort.  No  retractions. Lungs CTAB. Gastrointestinal: Soft and nontender. No distention. Musculoskeletal: Diffuse tenderness to palpation of right hip with shortening and external rotation noted.  No tenderness to palpation at left hip, bilateral knees, or bilateral ankles. Neurologic:  Normal speech and language. No gross focal neurologic deficits are appreciated.    ED Results / Procedures / Treatments   Labs (all labs ordered are listed, but only abnormal results are displayed) Labs Reviewed  CBC WITH DIFFERENTIAL/PLATELET - Abnormal; Notable for the following components:      Result Value   Eosinophils Absolute 0.6 (*)    All other components within normal limits  BASIC METABOLIC PANEL - Abnormal; Notable for the following components:   Glucose, Bld 190 (*)    All other components within normal limits  TYPE AND SCREEN     EKG  ED ECG REPORT I, 04/07/22, the attending physician, personally viewed and interpreted this ECG.   Date: 04/05/2022  EKG Time: 12:48  Rate: 80  Rhythm: normal sinus rhythm  Axis: Normal  Intervals:none  ST&T Change: None  RADIOLOGY Right hip x-ray reviewed and interpreted by me with periprosthetic  fracture noted in the proximal femur.  PROCEDURES:  Critical Care performed: No  Procedures   MEDICATIONS ORDERED IN ED: Medications  HYDROmorphone (DILAUDID) injection 0.5 mg (has no administration in time range)  morphine (PF) 4 MG/ML injection 4 mg (4 mg Intravenous Given 04/05/22 1355)     IMPRESSION / MDM / ASSESSMENT AND PLAN / ED COURSE  I reviewed the triage vital signs and the nursing notes.                              76 y.o. male with past medical history of lupus, GERD, hypothyroidism, and Parkinson disease who presents to the ED complaining of right hip pain after a trip and fall on the steps of his RV just prior to arrival.  Patient's presentation is most consistent with acute presentation with potential threat to life or bodily  function.  Differential diagnosis includes, but is not limited to, hip fracture, hip dislocation, hip contusion, neurovascular compromise.  Patient well-appearing and in no acute distress, vital signs are unremarkable and he is neurovascular intact to his distal right lower extremity.  Pain is improved following IV fentanyl with EMS and we will further assess with x-ray of his right hip.  He denies hitting his head or losing consciousness, does not take any blood thinners and there is no evidence of injury to his head or neck.  EKG shows no evidence of arrhythmia or ischemia and labs are unremarkable with no significant anemia, leukocytosis, AKI, or electrolyte abnormality.  X-ray of right hip is concerning for periprosthetic fracture, patient continues to have pain despite IV morphine and we will give a dose of IV Dilaudid.  Case discussed with Dr. Okey Dupre of orthopedics here at [facility], who states patient will require transfer to trauma center for revision of hip replacement.  We will reach out to [facility] for potential transfer.   FINAL CLINICAL IMPRESSION(S) / ED DIAGNOSES   Final diagnoses:  Periprosthetic fracture of hip, initial encounter  Fall, initial encounter     Rx / DC Orders   ED Discharge Orders     None        Note:  This document was prepared using Dragon voice recognition software and may include unintentional dictation errors.   Chesley Noon, MD 04/05/22 [phone]

## 2022-04-05 NOTE — ED Notes (Signed)
Report given to Garret Reddish, RN at [facility] ED.

## 2022-04-05 NOTE — ED Triage Notes (Signed)
Previous right and left hip replacement , fell , pain to left hip, left leg shortening, no loc, no thinners , tripped on rv steps

## 2022-04-05 NOTE — ED Notes (Signed)
Report given to [facility] with [facility]

## 2022-04-05 NOTE — ED Provider Notes (Signed)
Patient signed out to me pending transfer.  He has been accepted by [facility] Dr. Everardo Beals to ED to ED to transfer.  Will not keep n.p.o. he is not going for surgery tonight.   Georga Hacking, MD 04/05/22 2030

## 2022-04-05 NOTE — ED Notes (Signed)
Pt c/o unable to void, pt bladder scanned with result of 660 mls, EDP Mchugh notified. See orders for details

## 2023-08-18 ENCOUNTER — Ambulatory Visit
Admission: EM | Admit: 2023-08-18 | Discharge: 2023-08-18 | Disposition: A | Discharge status: Home / Self Care | Payer: Medicare Other | Attending: Physician Assistant | Admitting: Physician Assistant

## 2023-08-18 ENCOUNTER — Ambulatory Visit: Payer: Medicare Other

## 2023-08-18 DIAGNOSIS — Z1152 Encounter for screening for COVID-19: Secondary | ICD-10-CM | POA: Diagnosis not present

## 2023-08-18 DIAGNOSIS — R051 Acute cough: Secondary | ICD-10-CM | POA: Diagnosis not present

## 2023-08-18 DIAGNOSIS — R062 Wheezing: Secondary | ICD-10-CM | POA: Insufficient documentation

## 2023-08-18 DIAGNOSIS — J22 Unspecified acute lower respiratory infection: Secondary | ICD-10-CM | POA: Diagnosis present

## 2023-08-18 LAB — RESP PANEL BY RT-PCR (FLU A&B, COVID) ARPGX2
Influenza A by PCR: NEGATIVE
Influenza B by PCR: NEGATIVE
SARS Coronavirus 2 by RT PCR: NEGATIVE

## 2023-08-18 MED ORDER — ALBUTEROL SULFATE HFA 108 (90 BASE) MCG/ACT IN AERS
1.0000 | INHALATION_SPRAY | Freq: Four times a day (QID) | RESPIRATORY_TRACT | 0 refills | Status: AC | PRN
Start: 2023-08-18 — End: ?

## 2023-08-18 MED ORDER — BENZONATATE 200 MG PO CAPS: 200.0000 mg | ORAL_CAPSULE | Freq: Three times a day (TID) | ORAL | 0 refills | Status: AC | PRN

## 2023-08-18 MED ORDER — DOXYCYCLINE HYCLATE 100 MG PO CAPS: 100.0000 mg | ORAL_CAPSULE | Freq: Two times a day (BID) | ORAL | 0 refills | Status: AC

## 2023-08-18 NOTE — Discharge Instructions (Signed)
-  I sent an antibiotic, cough medicine and inhaler to the pharmacy.  Increase rest and fluids. - Use Tylenol for any aches or fevers. - I will call you if there is pneumonia on your x-ray and may send an additional antibiotic. - Know that your symptoms may last for a few weeks but if you develop a fever or feel worse you should be seen again and reevaluated. - If your x-ray shows pneumonia you should repeat it in 3 to 4 weeks to make sure things have cleared up.  I will call and that you know this if you have pneumonia on your x-ray.

## 2023-08-18 NOTE — ED Triage Notes (Signed)
Pt c/o Cough x3days  Pt asks for a covid test.  Pt was around her daughter who had viral pneumonia  Pt has been taking OTC medication and montelukast from his PCP.   Pt is taking clotrimazole for a throat fungal infection

## 2023-08-18 NOTE — ED Provider Notes (Signed)
[facility]    CSN: [id number] Arrival date & time: 08/18/23  1157      History   Chief Complaint Chief Complaint  Patient presents with   Cough    HPI Cameron Yu. is a 77 y.o. male with history of Parkinson's and lupus (in remission).  Patient presents with his wife today for 4-day history of cough, congestion, wheezing.  Denies fever, sore throat, sinus pain, ear pain, chest pain, shortness of breath, abdominal pain, vomiting or diarrhea.  He was recently around his daughter and her family who have been diagnosed with viral pneumonia.  He also reports that he is allergic to the family's cats and has been taking Singulair for that.  Also taking OTC cough meds.  No history of lung disease.  Former smoker.  HPI  Past Medical History:  Diagnosis Date   Arthritis    lower back   Elevated lipids    GERD (gastroesophageal reflux disease)    Hypothyroidism    Lupus    In remisssion since mid 2021   SLE exacerbation (HCC)    Sleep apnea    Vertigo    several years ago    There are no problems to display for this patient.   Past Surgical History:  Procedure Laterality Date   CATARACT EXTRACTION W/PHACO Right 11/15/2021   Procedure: CATARACT EXTRACTION PHACO AND INTRAOCULAR LENS PLACEMENT (IOC) RIGHT 12.26 01:04.0;  Surgeon: Galen Manila, MD;  Location: [facility];  Service: Ophthalmology;  Laterality: Right;   CATARACT EXTRACTION W/PHACO Left 11/29/2021   Procedure: CATARACT EXTRACTION PHACO AND INTRAOCULAR LENS PLACEMENT (IOC) LEFT 11.46 01:02.6;  Surgeon: Galen Manila, MD;  Location: [facility];  Service: Ophthalmology;  Laterality: Left;   COLONOSCOPY     COLONOSCOPY WITH PROPOFOL N/A 10/20/2016   Procedure: COLONOSCOPY WITH PROPOFOL;  Surgeon: Scot Jun, MD;  Location: [facility] END[facility];  Service: Endoscopy;  Laterality: N/A;   ESOPHAGOGASTRODUODENOSCOPY (EGD) WITH PROPOFOL N/A 10/20/2016   Procedure:  ESOPHAGOGASTRODUODENOSCOPY (EGD) WITH PROPOFOL;  Surgeon: Scot Jun, MD;  Location: [facility];  Service: Endoscopy;  Laterality: N/A;   HERNIA REPAIR Left 2014 and 2016   SIGMOIDOSCOPY     TOTAL HIP ARTHROPLASTY         Home Medications    Prior to Admission medications   Medication Sig Start Date End Date Taking? Authorizing Provider  albuterol (VENTOLIN HFA) 108 (90 Base) MCG/ACT inhaler Inhale 1-2 puffs into the lungs every 6 (six) hours as needed for wheezing or shortness of breath. 08/18/23  Yes Eusebio Friendly B, PA-C  ASPIRIN 81 PO Take by mouth daily.   Yes [provider]  benzonatate (TESSALON) 200 MG capsule Take 1 capsule (200 mg total) by mouth 3 (three) times daily as needed. 08/18/23  Yes Shirlee Latch, PA-C  buPROPion (WELLBUTRIN XL) 150 MG 24 hr tablet Take 150 mg by mouth 2 (two) times daily.   Yes [provider]  clotrimazole (MYCELEX) 10 MG troche SMARTSIG:1 Lozenge(s) By Mouth Every 6 Hours 08/02/23  Yes [provider]  desoximetasone (TOPICORT) 0.05 % cream Apply topically 2 (two) times daily.   Yes [provider]  doxycycline (VIBRAMYCIN) 100 MG capsule Take 1 capsule (100 mg total) by mouth 2 (two) times daily for 7 days. 08/18/23 08/25/23 Yes Eusebio Friendly B, PA-C  gabapentin (NEURONTIN) 300 MG capsule Take 300 mg by mouth 3 (three) times daily.   Yes [provider]  levothyroxine (SYNTHROID, LEVOTHROID) 50 MCG  tablet Take 50 mcg by mouth daily before breakfast.   Yes [provider]  montelukast (SINGULAIR) 10 MG tablet Take 10 mg by mouth daily as needed.   Yes [provider]  Multiple Vitamins-Minerals (CENTRUM SILVER 50+MEN PO) Take by mouth daily.   Yes [provider]  omeprazole (PRILOSEC) 20 MG capsule Take 40 mg by mouth daily.   Yes [provider]  rosuvastatin (CRESTOR) 10 MG tablet Take 10 mg by mouth daily.   Yes [provider]  ALPRAZolam (XANAX)  0.25 MG tablet Take 0.25 mg by mouth at bedtime as needed for anxiety.    [provider]  amoxicillin (AMOXIL) 500 MG tablet Take 2,000 mg by mouth. Prior to dental procedures    [provider]  fluocinonide gel (LIDEX) 0.05 % Apply 1 application topically 2 (two) times daily.    [provider]  folic acid (FOLVITE) 1 MG tablet Take 3 mg by mouth daily.    [provider]  hydroxychloroquine (PLAQUENIL) 200 MG tablet Take 200 mg by mouth daily.    [provider]  methotrexate (RHEUMATREX) 7.5 MG tablet Take 7.5 mg by mouth once a week. Caution" Chemotherapy. Protect from light.    [provider]  Trolamine Salicylate (ASPERCREME EX) Apply topically as needed.    [provider]    Family History History reviewed. No pertinent family history.  Social History Social History   Tobacco Use   Smoking status: Former    Current packs/day: 0.00    Types: Cigarettes    Quit date: 1967    Years since quitting: 57.8   Smokeless tobacco: Never  Vaping Use   Vaping status: Never Used  Substance Use Topics   Alcohol use: Yes    Alcohol/week: 7.0 standard drinks of alcohol    Types: 7 Standard drinks or equivalent per week    Comment: occasional   Drug use: No     Allergies   Buspirone, Metformin and related, Oxycodone hcl, and Sertraline hcl   Review of Systems Review of Systems  Constitutional:  Negative for fatigue and fever.  HENT:  Positive for congestion and rhinorrhea. Negative for sinus pressure, sinus pain and sore throat.   Respiratory:  Positive for cough and wheezing. Negative for shortness of breath.   Cardiovascular:  Negative for chest pain.  Gastrointestinal:  Negative for abdominal pain, diarrhea, nausea and vomiting.  Musculoskeletal:  Negative for myalgias.  Neurological:  Negative for weakness, light-headedness and headaches.  Hematological:  Negative for adenopathy.     Physical Exam Triage  Vital Signs ED Triage Vitals  Encounter Vitals Group     BP      Systolic BP Percentile      Diastolic BP Percentile      Pulse      Resp      Temp      Temp src      SpO2      Weight      Height      Head Circumference      Peak Flow      Pain Score      Pain Loc      Pain Education      Exclude from Growth Chart    No data found.  Updated Vital Signs BP 121/63 (BP Location: Left Arm)   Pulse 71   Temp 97.7 F (36.5 C) (Oral)   Ht 5\' 6"  (1.676 m)   Wt 177 lb (  80.3 kg)   SpO2 94%   BMI 28.57 kg/m     Physical Exam Vitals and nursing note reviewed.  Constitutional:      General: He is not in acute distress.    Appearance: Normal appearance. He is well-developed. He is not ill-appearing.  HENT:     Head: Normocephalic and atraumatic.     Nose: Congestion present.     Mouth/Throat:     Mouth: Mucous membranes are moist.     Pharynx: Oropharynx is clear.  Eyes:     General: No scleral icterus.    Conjunctiva/sclera: Conjunctivae normal.  Cardiovascular:     Rate and Rhythm: Normal rate and regular rhythm.  Pulmonary:     Effort: Pulmonary effort is normal. No respiratory distress.     Breath sounds: Wheezing present.  Musculoskeletal:     Cervical back: Neck supple.  Skin:    General: Skin is warm and dry.     Capillary Refill: Capillary refill takes less than 2 seconds.  Neurological:     General: No focal deficit present.     Mental Status: He is alert. Mental status is at baseline.     Motor: No weakness.     Gait: Gait normal.  Psychiatric:        Mood and Affect: Mood normal.        Behavior: Behavior normal.      UC Treatments / Results  Labs (all labs ordered are listed, but only abnormal results are displayed) Labs Reviewed  RESP PANEL BY RT-PCR (FLU A&B, COVID) ARPGX2    EKG   Radiology No results found.  Procedures Procedures (including critical care time)  Medications Ordered in UC Medications - No data to  display  Initial Impression / Assessment and Plan / UC Course  I have reviewed the triage vital signs and the nursing notes.  Pertinent labs & imaging results that were available during my care of the patient were reviewed by me and considered in my medical decision making (see chart for details).   77 year old male with history of Parkinson's disease and lupus (intermission) presents for 40 history of cough, congestion, wheezing.  Has been around his daughter who was diagnosed with viral pneumonia.  No associated fever and he does not feel short of breath.  Vitals are normal and stable.  He is overall well-appearing.  No acute distress.  On exam he is slight nasal congestion.  Throat clear.  Few scattered wheezes.  Negative flu and COVID test.  Chest x-ray obtained to assess for possible pneumonia.  Patient declines nebulizer but is open to an inhaler so I sent her to pharmacy.  Wet read x-ray shows concern for right sided pneumonia.  Will go ahead and treat with doxycycline given that he is immunocompromised and has comorbidities.  Also sent benzonatate for cough.  Advised increase rest and fluids.  If there is pneumonia seen on the x-ray we will contact him and add additional antibiotic.  Thoroughly reviewed return and ED precautions.  Chest x-ray is negative per radiologist.  Final Clinical Impressions(s) / UC Diagnoses   Final diagnoses:  Lower respiratory infection  Acute cough  Wheezing     Discharge Instructions      -I sent an antibiotic, cough medicine and inhaler to the pharmacy.  Increase rest and fluids. - Use Tylenol for any aches or fevers. - I will call you if there is pneumonia on your x-ray and may send an additional antibiotic. - Know  that your symptoms may last for a few weeks but if you develop a fever or feel worse you should be seen again and reevaluated. - If your x-ray shows pneumonia you should repeat it in 3 to 4 weeks to make sure things have cleared  up.  I will call and that you know this if you have pneumonia on your x-ray.     ED Prescriptions     Medication Sig Dispense Auth. Provider   doxycycline (VIBRAMYCIN) 100 MG capsule Take 1 capsule (100 mg total) by mouth 2 (two) times daily for 7 days. 14 capsule Eusebio Friendly B, PA-C   benzonatate (TESSALON) 200 MG capsule Take 1 capsule (200 mg total) by mouth 3 (three) times daily as needed. 30 capsule Eusebio Friendly B, PA-C   albuterol (VENTOLIN HFA) 108 (90 Base) MCG/ACT inhaler Inhale 1-2 puffs into the lungs every 6 (six) hours as needed for wheezing or shortness of breath. 1 g Shirlee Latch, PA-C      PDMP not reviewed this encounter.   Shirlee Latch, PA-C 08/18/23 1437

## 2024-03-02 IMAGING — MR MR HEAD W/O CM
13 series · 48 of 48 positions shown · non-contrast
Comparison: Brain MRI 07/16/2019

CLINICAL DATA: Parkinson's, worsening vertigo common tremors

EXAM:
MRI HEAD WITHOUT CONTRAST
TECHNIQUE: Multiplanar, multiecho pulse sequences of the brain and surrounding
structures were obtained without intravenous contrast.

[Series 5: ax dwi_tracew · axial · 3.0mm · 0.65mm/px · z∈[-59,+92]mm · 2 of 48 slices shown]
[im 1/48]
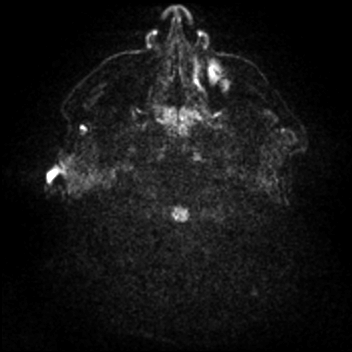
[im 48/48]
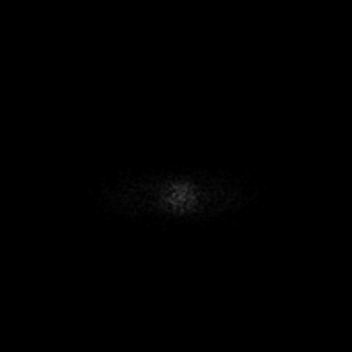

[Series 6: ax dwi_adc · axial · 3.0mm · 0.65mm/px · z∈[-59,+89]mm · 3 of 47 slices shown]
[im 1/47]
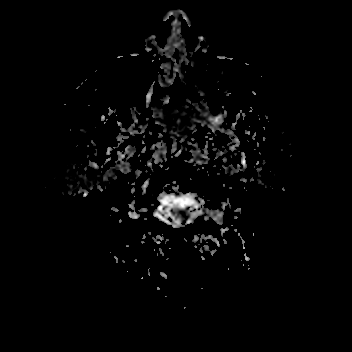
[im 24/47]
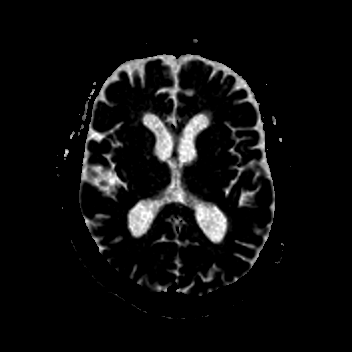
[im 47/47]
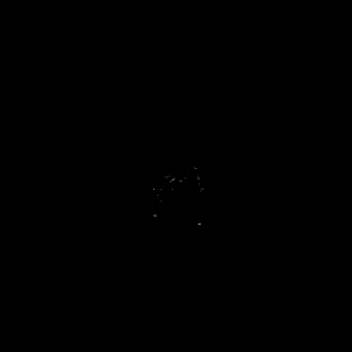

[Series 7: cor dwi_tracew · coronal · 5.0mm · 0.60mm/px · 2 of 38 slices shown]
[im 1/38]
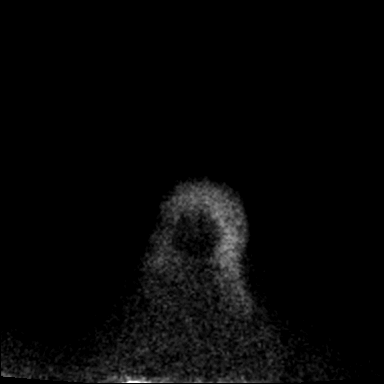
[im 38/38]
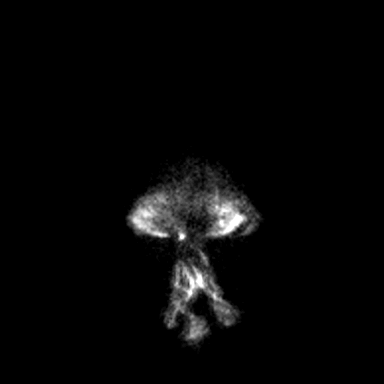

[Series 8: cor dwi_adc · coronal · 5.0mm · 0.60mm/px · 2 of 36 slices shown]
[im 1/36]
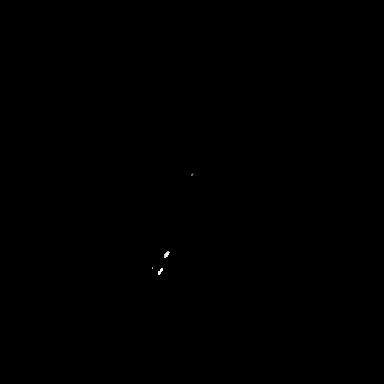
[im 36/36]
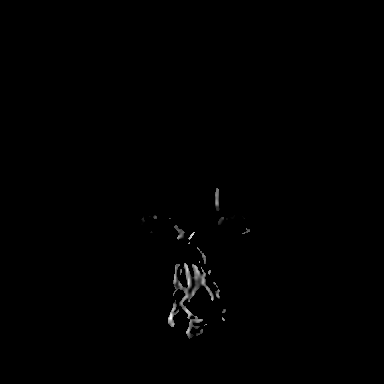

[Series 9: T1 · sagittal · 5.0mm · 0.62mm/px · 2 of 25 slices shown (1 of 2)]
[im 1/25]
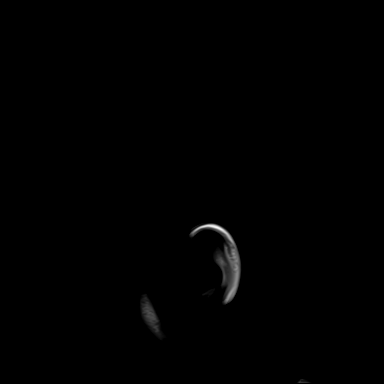
[im 25/25]
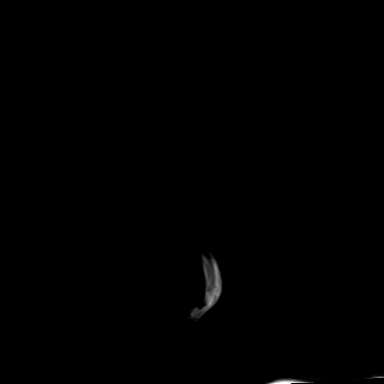

[Series 10: T2 · axial · 5.0mm · 0.53mm/px · z∈[-55,+85]mm · 2 of 25 slices shown (1 of 2)]
[im 1/25]
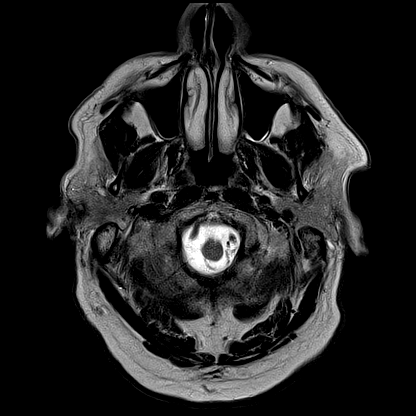
[im 25/25]
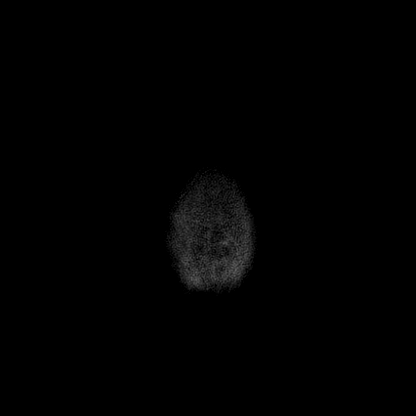

[Series 11: ax swi_mag · axial · 2.0mm · 0.90mm/px · z∈[-61,+93]mm · 5 of 80 slices shown]
[im 1/80]
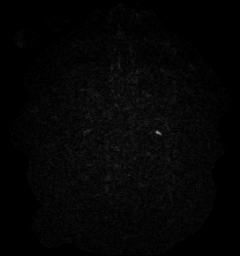
[im 20/80]
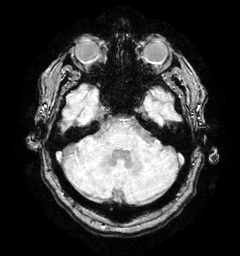
[im 40/80]
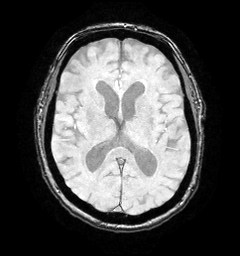
[im 60/80]
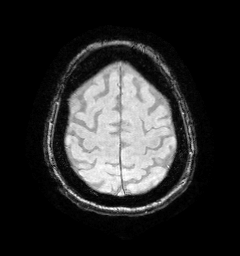
[im 80/80]
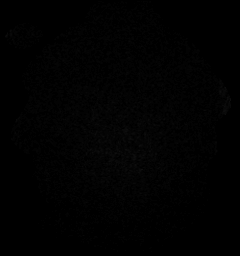

[Series 12: ax swi_pha · axial · 2.0mm · 0.90mm/px · z∈[-61,+93]mm · 5 of 80 slices shown]
[im 1/80]
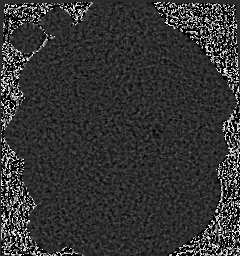
[im 20/80]
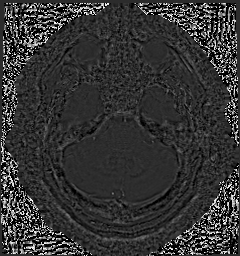
[im 40/80]
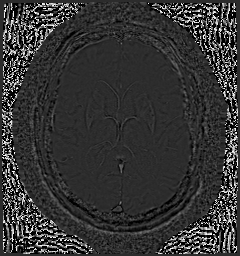
[im 60/80]
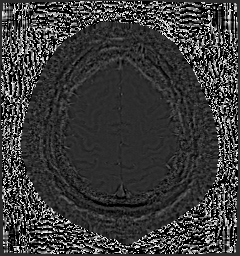
[im 80/80]
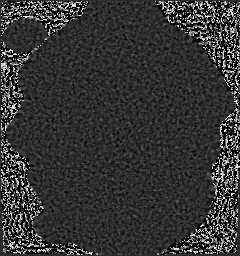

[Series 13: ax swi_swi · axial · 2.0mm · 0.90mm/px · z∈[-61,+93]mm · 5 of 80 slices shown]
[im 1/80]
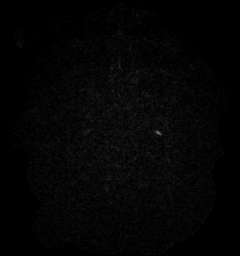
[im 20/80]
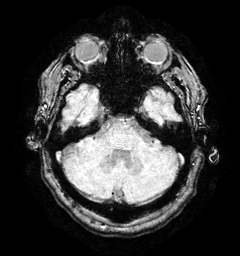
[im 40/80]
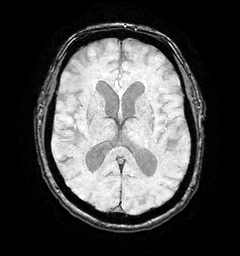
[im 60/80]
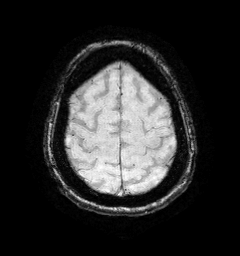
[im 80/80]
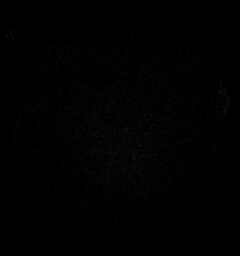

[Series 14: ax swi_swi_mip · axial · 16.0mm · 0.90mm/px · z∈[-54,+86]mm · 4 of 73 slices shown]
[im 1/73]
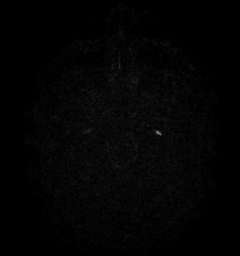
[im 25/73]
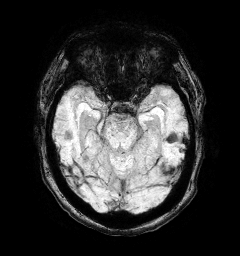
[im 49/73]
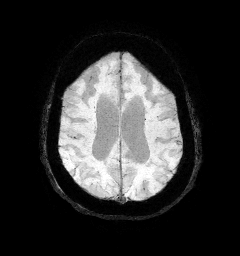
[im 73/73]
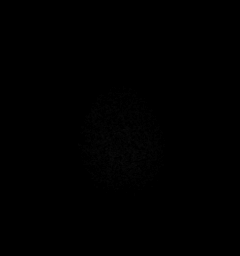

[Series 15: FLAIR · axial · 3.0mm · 0.53mm/px · z∈[-64,+94]mm · 3 of 55 slices shown]
[im 1/55]
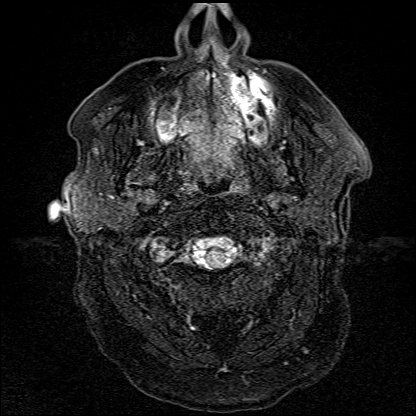
[im 28/55]
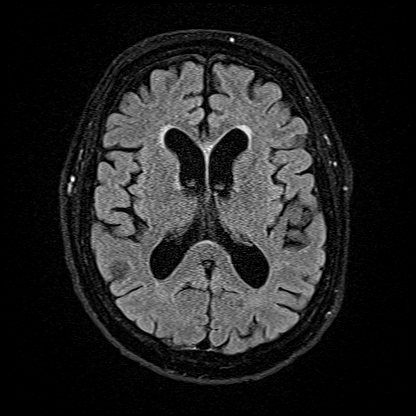
[im 55/55]
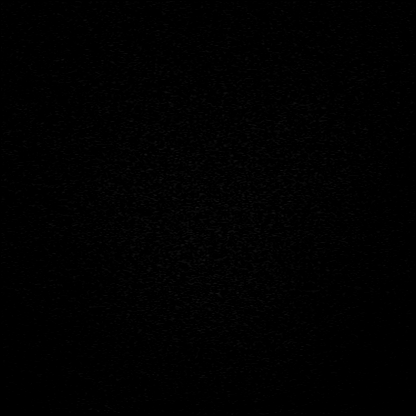

[Series 16: T1 · axial · 1.0mm · 0.98mm/px · z∈[-67,+104]mm · 11 of 176 slices shown (2 of 2)]
[im 1/176]
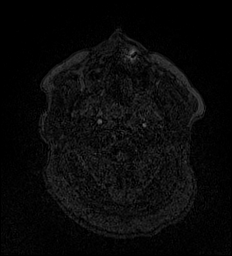
[im 18/176]
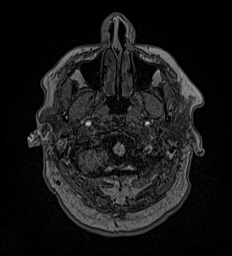
[im 36/176]
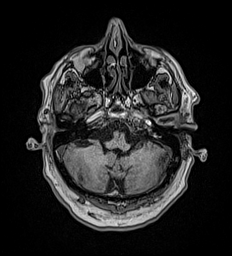
[im 53/176]
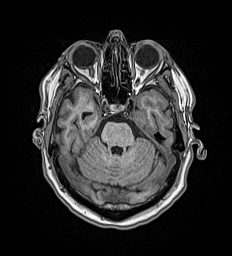
[im 71/176]
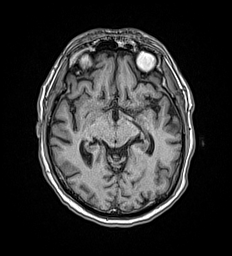
[im 88/176]
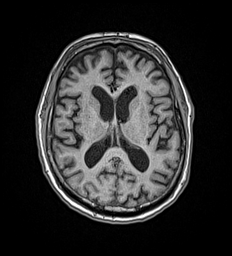
[im 106/176]
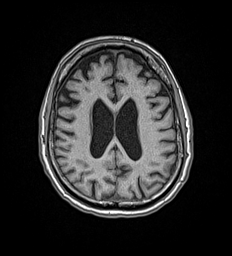
[im 123/176]
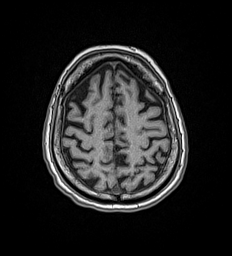
[im 141/176]
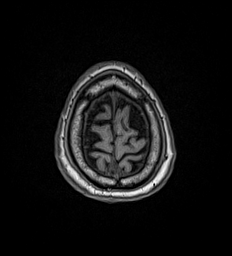
[im 158/176]
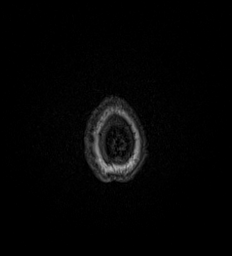
[im 176/176]
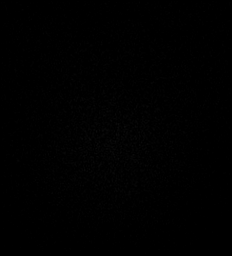

[Series 17: T2 · coronal · 5.0mm · 0.57mm/px · 2 of 29 slices shown (2 of 2)]
[im 1/29]
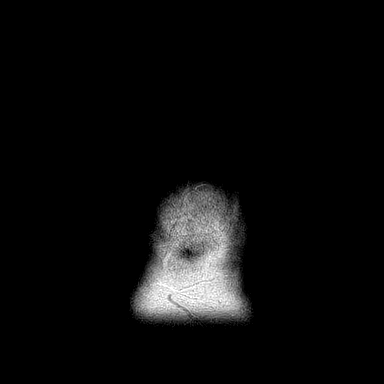
[im 29/29]
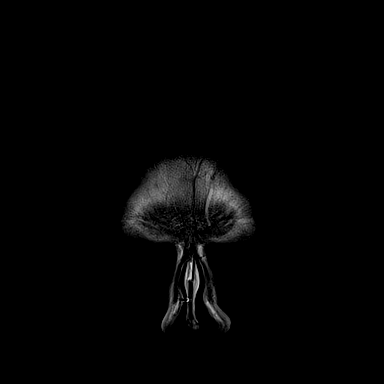

[48 of 48 positions shown; findings below may reference images not displayed]

FINDINGS: Brain: There is no acute intracranial hemorrhage, extra-axial fluid
collection, or acute infarct.

Background parenchymal volume is normal for age. The ventricles are
stable in size. There is minimal FLAIR signal abnormality in the
periventricular white matter likely reflecting minimal chronic white
matter microangiopathy. There is a small remote lacunar infarct in
the right cerebellar hemisphere, unchanged.

There is no suspicious parenchymal signal abnormality. There is no
mass lesion. There is no mass effect or midline shift.

Vascular: Normal flow voids.

Skull and upper cervical spine: Normal marrow signal.

Sinuses/Orbits: The paranasal sinuses are clear. Bilateral lens
implants are in place. The globes and orbits are otherwise
unremarkable.

Other: None.
IMPRESSION: Small remote lacunar infarct in the right cerebellar hemisphere,
unchanged. Otherwise, unremarkable for age brain MRI with no acute
intracranial pathology.
